# Patient Record
Sex: Male | Born: 1975 | Race: Black or African American | Hispanic: No | Marital: Married | Smoking: Never smoker
Health system: Southern US, Community
[De-identification: ages and names within clinical notes are randomized; demographics above are authoritative.]

## PROBLEM LIST (undated history)

## (undated) DIAGNOSIS — J45909 Unspecified asthma, uncomplicated: Secondary | ICD-10-CM

## (undated) DIAGNOSIS — I10 Essential (primary) hypertension: Secondary | ICD-10-CM

## (undated) DIAGNOSIS — E7800 Pure hypercholesterolemia, unspecified: Secondary | ICD-10-CM

## (undated) HISTORY — PX: HERNIA REPAIR: SHX51

## (undated) HISTORY — PX: OTHER SURGICAL HISTORY: SHX169

---

## 2018-02-26 ENCOUNTER — Other Ambulatory Visit: Payer: Self-pay

## 2018-02-26 ENCOUNTER — Encounter (HOSPITAL_COMMUNITY): Payer: Self-pay | Admitting: *Deleted

## 2018-02-26 ENCOUNTER — Emergency Department (HOSPITAL_COMMUNITY)
Admission: EM | Admit: 2018-02-26 | Discharge: 2018-02-26 | Disposition: A | Discharge status: Home / Self Care | Payer: 59 | Attending: Emergency Medicine | Admitting: Emergency Medicine

## 2018-02-26 DIAGNOSIS — I10 Essential (primary) hypertension: Secondary | ICD-10-CM | POA: Insufficient documentation

## 2018-02-26 DIAGNOSIS — J45909 Unspecified asthma, uncomplicated: Secondary | ICD-10-CM | POA: Insufficient documentation

## 2018-02-26 DIAGNOSIS — R51 Headache: Secondary | ICD-10-CM | POA: Insufficient documentation

## 2018-02-26 DIAGNOSIS — Z79899 Other long term (current) drug therapy: Secondary | ICD-10-CM | POA: Insufficient documentation

## 2018-02-26 HISTORY — DX: Unspecified asthma, uncomplicated: J45.909

## 2018-02-26 HISTORY — DX: Pure hypercholesterolemia, unspecified: E78.00

## 2018-02-26 HISTORY — DX: Essential (primary) hypertension: I10

## 2018-02-26 MED ORDER — LOSARTAN POTASSIUM 50 MG PO TABS: 50.0000 mg | ORAL_TABLET | Freq: Every day | ORAL | 0 refills | Status: AC

## 2018-02-26 NOTE — ED Notes (Signed)
Patient verbalizes understanding of discharge instructions. Opportunity for questioning and answers were provided. Armband removed by staff, pt discharged from ED.  

## 2018-02-26 NOTE — ED Provider Notes (Signed)
[facility] Provider Note   CSN: [id number] Arrival date & time: 02/26/18  1335     History   Chief Complaint Chief Complaint  Patient presents with  . Hypertension  . Headache    HPI Fred Washington is a 43 y.o. male.  Pt presents to the ED today with elevated BP.  The pt has a hx of HTN and does not have a pcp here in town.  [place] and [place] medical insurance.  He admits he should have gotten a doctor by now, but has not made his health a priority.  He has a stressful job and [employer] also.  Pt ran out of his BP meds yesterday and had a headache today.  He went to urgent care, but they would not refill his meds.  They told him to come here.  The pt denies any cp or sob.  Headache is mild and no neurologic sx.     Past Medical History:  Diagnosis Date  . Asthma   . Hypercholesteremia   . Hypertension     There are no active problems to display for this patient.   Past Surgical History:  Procedure Laterality Date  . HERNIA REPAIR     inguinal, L  . uretheral repair          Home Medications    Prior to Admission medications   Medication Sig Start Date End Date Taking? Authorizing Provider  losartan (COZAAR) 50 MG tablet Take 1 tablet (50 mg total) by mouth daily. 02/26/18   Jacalyn LefevreHaviland, Labradford Schnitker, MD    Family History No family history on file.  Social History Social History   Tobacco Use  . Smoking status: Never Smoker  . Smokeless tobacco: Never Used  Substance Use Topics  . Alcohol use: Yes  . Drug use: Never     Allergies   Patient has no known allergies.   Review of Systems Review of Systems  Neurological: Positive for headaches.  All other systems reviewed and are negative.    Physical Exam Updated Vital Signs BP (!) 155/109 (BP Location: Left Arm)   Pulse 60   Temp 98.4 F (36.9 C) (Oral)   Resp 18   Ht 6\' 5"  (1.956 m)   Wt 115.7 kg   SpO2 98%   BMI 30.24 kg/m   Physical  Exam Vitals signs and nursing note reviewed.  Constitutional:      Appearance: He is well-developed and normal weight.  HENT:     Head: Normocephalic and atraumatic.     Mouth/Throat:     Mouth: Mucous membranes are moist.     Pharynx: Oropharynx is clear.  Eyes:     Extraocular Movements: Extraocular movements intact.     Pupils: Pupils are equal, round, and reactive to light.  Neck:     Musculoskeletal: Normal range of motion and neck supple.  Cardiovascular:     Rate and Rhythm: Normal rate and regular rhythm.     Heart sounds: Normal heart sounds.  Pulmonary:     Effort: Pulmonary effort is normal.     Breath sounds: Normal breath sounds.  Abdominal:     General: Bowel sounds are normal.     Palpations: Abdomen is soft.  Musculoskeletal: Normal range of motion.  Skin:    General: Skin is warm and dry.     Capillary Refill: Capillary refill takes less than 2 seconds.  Neurological:     Mental  Status: He is alert and oriented to person, place, and time.  Psychiatric:        Mood and Affect: Mood normal.        Speech: Speech normal.        Behavior: Behavior normal.      ED Treatments / Results  Labs (all labs ordered are listed, but only abnormal results are displayed) Labs Reviewed - No data to display  EKG None  Radiology No results found.  Procedures Procedures (including critical care time)  Medications Ordered in ED Medications - No data to display   Initial Impression / Assessment and Plan / ED Course  I have reviewed the triage vital signs and the nursing notes.  Pertinent labs & imaging results that were available during my care of the patient were reviewed by me and considered in my medical decision making (see chart for details).    Pt's losartan will be refilled for a 1 month supply.  That gives him time to find a pcp and to establish care.  He knows to return at any time.  Final Clinical Impressions(s) / ED Diagnoses   Final diagnoses:   Essential hypertension    ED Discharge Orders         Ordered    losartan (COZAAR) 50 MG tablet  Daily     02/26/18 1402           Jacalyn Lefevre, MD 02/26/18 1407

## 2018-02-26 NOTE — ED Triage Notes (Signed)
Pt here from [facility] for bp meds.  Recently moved here from [place] an does not have PCP.  [facility] doc stated that since his diastolic was 100 he needed to come here.  States mild headache.  Denies blurred vision, dizziness, etc.

## 2018-03-10 DIAGNOSIS — I10 Essential (primary) hypertension: Secondary | ICD-10-CM | POA: Insufficient documentation

## 2021-04-23 ENCOUNTER — Ambulatory Visit: Payer: 59 | Admitting: Sports Medicine

## 2021-04-23 NOTE — Progress Notes (Signed)
? ? Fred Washington D.Judd Gaudier ?[facility] ?[address] [place] [address] ?Phone: [phone] ?  ?Assessment and Plan:   ?  ?1. Chronic bilateral low back pain without sciatica ?2. Somatic dysfunction of lumbar region ?3. Somatic dysfunction of pelvic region ?4. Somatic dysfunction of sacral region ?-Chronic with exacerbation, initial sports medicine visit ?- Chronic low back pain with pain starting 4 months ago, generally improving, and then flaring over the past week ?- Suspect strain of lower lumbar musculature based on HPI, physical exam, unremarkable x-rays ?- Start meloxicam 15 mg daily x2 weeks.  If still having pain after 2 weeks, complete 3rd-week of meloxicam. May use remaining meloxicam as needed once daily for pain control.  Do not to use additional NSAIDs while taking meloxicam.  May use Tylenol [phone] mg 2 to 3 times a day for breakthrough pain. ?- Start HEP for low back and posterior chain ?- X-ray obtained in clinic.  My interpretation: No acute fracture, no pars fracture, no vertebral collapse.  Unremarkable lumbar x-ray ?- Patient has received significant relief with OMT in the past.  Elects for repeat OMT today.  Tolerated well per note below. ?- Decision today to treat with OMT was based on Physical Exam ? ?After verbal consent patient was treated with HVLA (high velocity low amplitude), ME (muscle energy), FPR (flex positional release), ST (soft tissue), PC/PD (Pelvic Compression/ Pelvic Decompression) techniques in   thoracic, lumbar, and pelvic areas. Patient tolerated the procedure well with improvement in symptoms.  Patient educated on potential side effects of soreness and recommended to rest, hydrate, and use Tylenol as needed for pain control.  ?  ?Pertinent previous records reviewed include none ?  ?Follow Up: 3 weeks for reevaluation.  Could consider repeat TEE if patient found it beneficial today ?  ?Subjective:   ?I, Fred Washington, am serving as a  Neurosurgeon for Doctor [employer] ? ?Chief Complaint: low back pain  ? ?HPI:  ?04/24/2021 ?Patient is a 46 year old male complaining of low back pain. Patient states that about 4 months ago he felt something when he was lifting weights after about a month it went a way last Thursday he felt something similar he was sitting Monday for a meeting and that feeling came back . Feels like a spasm , the base of his spine and it makes it hard to stand up and walk , no numbness tingling, pain along the low back , has been taking advil and it eases the pain, started using ice yesterday hasn't been using heat other than the shower. To relieve the pain he likes to turn his body to feel a pop to give it some relief ? ?Relevant Historical Information: Hypertension, hyperlipidemia ? ?Additional pertinent review of systems negative. ? ? ?Current Outpatient Medications:  ?  losartan (COZAAR) 50 MG tablet, Take 1 tablet (50 mg total) by mouth daily., Disp: 30 tablet, Rfl: 0 ?  meloxicam (MOBIC) 15 MG tablet, Take 1 tablet (15 mg total) by mouth daily., Disp: 30 tablet, Rfl: 0  ? ?Objective:   ?  ?Vitals:  ? 04/24/21 0828  ?BP: 124/80  ?Pulse: 77  ?SpO2: 97%  ?Weight: 257 lb (116.6 kg)  ?Height: 6\' 5"  (1.956 m)  ?  ?  ?Body mass index is 30.48 kg/m?.  ?  ?Physical Exam:   ? ?Gen: Appears well, nad, nontoxic and pleasant ?Psych: Alert and oriented, appropriate mood and affect ?Neuro: sensation intact, strength is 5/5 in upper  and lower extremities, muscle tone wnl ?Skin: no susupicious lesions or rashes ? ?Back - Normal skin, Spine with normal alignment and no deformity.   ?No tenderness to vertebral process palpation.   ?Lumbar paraspinous muscles are mildly tender and without spasm ?Straight leg raise negative ?Trendelenberg negative ?  ? ?OMT Physical Exam: ? ?ASIS Compression Test: Positive Right ?Sacrum: Negative sphinx, NTTP sacral base bilaterally ?Lumbar: Mildly TTP paraspinal, L2 RRSR ?Pelvis: Right anterior innominate   ? ? ?Electronically signed by:  ?Fred Washington D.Judd Gaudier ?[facility] ?9:10 AM 04/24/21 ?

## 2021-04-24 ENCOUNTER — Ambulatory Visit: Payer: 59 | Admitting: Sports Medicine

## 2021-04-24 ENCOUNTER — Ambulatory Visit (INDEPENDENT_AMBULATORY_CARE_PROVIDER_SITE_OTHER): Payer: 59

## 2021-04-24 ENCOUNTER — Other Ambulatory Visit: Payer: Self-pay

## 2021-04-24 VITALS — BP 124/80 | HR 77 | Ht 77.0 in | Wt 257.0 lb

## 2021-04-24 DIAGNOSIS — G8929 Other chronic pain: Secondary | ICD-10-CM | POA: Diagnosis not present

## 2021-04-24 DIAGNOSIS — M9903 Segmental and somatic dysfunction of lumbar region: Secondary | ICD-10-CM | POA: Diagnosis not present

## 2021-04-24 DIAGNOSIS — M545 Low back pain, unspecified: Secondary | ICD-10-CM

## 2021-04-24 DIAGNOSIS — E782 Mixed hyperlipidemia: Secondary | ICD-10-CM | POA: Insufficient documentation

## 2021-04-24 DIAGNOSIS — M9904 Segmental and somatic dysfunction of sacral region: Secondary | ICD-10-CM

## 2021-04-24 DIAGNOSIS — M9905 Segmental and somatic dysfunction of pelvic region: Secondary | ICD-10-CM | POA: Diagnosis not present

## 2021-04-24 MED ORDER — MELOXICAM 15 MG PO TABS: 15.0000 mg | ORAL_TABLET | Freq: Every day | ORAL | 0 refills | Status: AC

## 2021-04-24 NOTE — Patient Instructions (Addendum)
Good to see you  - Start meloxicam 15 mg daily x2 weeks.  If still having pain after 2 weeks, complete 3rd-week of meloxicam. May use remaining meloxicam as needed once daily for pain control.  Do not to use additional NSAIDs while taking meloxicam.  May use Tylenol 500-1000 mg 2 to 3 times a day for breakthrough pain. Low back HEP 3 week follow up  

## 2021-05-21 NOTE — Progress Notes (Deleted)
    Benito Mccreedy D.Cowden Harper Phone: [phone]   Assessment and Plan:     There are no diagnoses linked to this encounter.  ***   Pertinent previous records reviewed include ***   Follow Up: ***     Subjective:   I, Sage Hammill, am serving as a Education administrator for Doctor Glennon Mac   Chief Complaint: low back pain    HPI:  04/24/2021 Patient is a 46 year old male complaining of low back pain. Patient states that about 4 months ago he felt something when he was lifting weights after about a month it went a way last Thursday he felt something similar he was sitting Monday for a meeting and that feeling came back . Feels like a spasm , the base of his spine and it makes it hard to stand up and walk , no numbness tingling, pain along the low back , has been taking advil and it eases the pain, started using ice yesterday hasn't been using heat other than the shower. To relieve the pain he likes to turn his body to feel a pop to give it some relief  05/22/2021 Patient states     Relevant Historical Information: Hypertension, hyperlipidemia  Additional pertinent review of systems negative.   Current Outpatient Medications:    losartan (COZAAR) 50 MG tablet, Take 1 tablet (50 mg total) by mouth daily., Disp: 30 tablet, Rfl: 0   meloxicam (MOBIC) 15 MG tablet, Take 1 tablet (15 mg total) by mouth daily., Disp: 30 tablet, Rfl: 0   Objective:     There were no vitals filed for this visit.    There is no height or weight on file to calculate BMI.    Physical Exam:    ***   Electronically signed by:  Benito Mccreedy D.[facility] 2:58 PM 05/21/21

## 2021-05-22 ENCOUNTER — Ambulatory Visit: Payer: 59 | Admitting: Sports Medicine

## 2021-06-07 ENCOUNTER — Emergency Department (HOSPITAL_BASED_OUTPATIENT_CLINIC_OR_DEPARTMENT_OTHER)
Admission: EM | Admit: 2021-06-07 | Discharge: 2021-06-07 | Disposition: A | Discharge status: Home / Self Care | Payer: 59 | Attending: Emergency Medicine | Admitting: Emergency Medicine

## 2021-06-07 ENCOUNTER — Encounter (HOSPITAL_BASED_OUTPATIENT_CLINIC_OR_DEPARTMENT_OTHER): Payer: Self-pay | Admitting: Emergency Medicine

## 2021-06-07 ENCOUNTER — Other Ambulatory Visit: Payer: Self-pay

## 2021-06-07 ENCOUNTER — Emergency Department (HOSPITAL_BASED_OUTPATIENT_CLINIC_OR_DEPARTMENT_OTHER): Payer: 59

## 2021-06-07 DIAGNOSIS — W01198A Fall on same level from slipping, tripping and stumbling with subsequent striking against other object, initial encounter: Secondary | ICD-10-CM | POA: Diagnosis not present

## 2021-06-07 DIAGNOSIS — S0181XA Laceration without foreign body of other part of head, initial encounter: Secondary | ICD-10-CM | POA: Diagnosis not present

## 2021-06-07 DIAGNOSIS — S0993XA Unspecified injury of face, initial encounter: Secondary | ICD-10-CM | POA: Diagnosis present

## 2021-06-07 DIAGNOSIS — R7989 Other specified abnormal findings of blood chemistry: Secondary | ICD-10-CM | POA: Insufficient documentation

## 2021-06-07 DIAGNOSIS — J45909 Unspecified asthma, uncomplicated: Secondary | ICD-10-CM | POA: Diagnosis not present

## 2021-06-07 DIAGNOSIS — F10120 Alcohol abuse with intoxication, uncomplicated: Secondary | ICD-10-CM | POA: Insufficient documentation

## 2021-06-07 DIAGNOSIS — E860 Dehydration: Secondary | ICD-10-CM | POA: Diagnosis not present

## 2021-06-07 DIAGNOSIS — R55 Syncope and collapse: Secondary | ICD-10-CM | POA: Insufficient documentation

## 2021-06-07 DIAGNOSIS — Z23 Encounter for immunization: Secondary | ICD-10-CM | POA: Insufficient documentation

## 2021-06-07 DIAGNOSIS — F10920 Alcohol use, unspecified with intoxication, uncomplicated: Secondary | ICD-10-CM

## 2021-06-07 DIAGNOSIS — Y906 Blood alcohol level of 120-199 mg/100 ml: Secondary | ICD-10-CM | POA: Insufficient documentation

## 2021-06-07 LAB — COMPREHENSIVE METABOLIC PANEL
ALT: 38 U/L (ref 0–44)
AST: 30 U/L (ref 15–41)
Albumin: 4.4 g/dL (ref 3.5–5.0)
Alkaline Phosphatase: 25 U/L — ABNORMAL LOW (ref 38–126)
Anion gap: 16 — ABNORMAL HIGH (ref 5–15)
BUN: 12 mg/dL (ref 6–20)
CO2: 20 mmol/L — ABNORMAL LOW (ref 22–32)
Calcium: 9.2 mg/dL (ref 8.9–10.3)
Chloride: 103 mmol/L (ref 98–111)
Creatinine, Ser: 1.5 mg/dL — ABNORMAL HIGH (ref 0.61–1.24)
GFR, Estimated: 59 mL/min — ABNORMAL LOW (ref >60)
Glucose, Bld: 175 mg/dL — ABNORMAL HIGH (ref 70–99)
Potassium: 4.2 mmol/L (ref 3.5–5.1)
Sodium: 139 mmol/L (ref 135–145)
Total Bilirubin: 0.8 mg/dL (ref 0.3–1.2)
Total Protein: 7.2 g/dL (ref 6.5–8.1)

## 2021-06-07 LAB — CBC
HCT: 44 % (ref 39.0–52.0)
Hemoglobin: 13.8 g/dL (ref 13.0–17.0)
MCH: 25.5 pg — ABNORMAL LOW (ref 26.0–34.0)
MCHC: 31.4 g/dL (ref 30.0–36.0)
MCV: 81.2 fL (ref 80.0–100.0)
Platelets: 246 10*3/uL (ref 150–400)
RBC: 5.42 MIL/uL (ref 4.22–5.81)
RDW: 14.8 % (ref 11.5–15.5)
WBC: 5.3 10*3/uL (ref 4.0–10.5)
nRBC: 0 % (ref 0.0–0.2)

## 2021-06-07 LAB — ETHANOL: Alcohol, Ethyl (B): 190 mg/dL — ABNORMAL HIGH (ref <10)

## 2021-06-07 MED ORDER — LIDOCAINE HCL (PF) 1 % IJ SOLN
10.0000 mL | Freq: Once | INTRAMUSCULAR | Status: AC
  Administered 2021-06-07: 10 mL
  Filled 2021-06-07: qty 10

## 2021-06-07 MED ORDER — TETANUS-DIPHTH-ACELL PERTUSSIS 5-2.5-18.5 LF-MCG/0.5 IM SUSY
0.5000 mL | PREFILLED_SYRINGE | Freq: Once | INTRAMUSCULAR | Status: AC
  Administered 2021-06-07: 0.5 mL via INTRAMUSCULAR
  Filled 2021-06-07: qty 0.5

## 2021-06-07 MED ORDER — SODIUM CHLORIDE 0.9 % IV BOLUS (SEPSIS)
1000.0000 mL | Freq: Once | INTRAVENOUS | Status: AC
  Administered 2021-06-07: 1000 mL via INTRAVENOUS

## 2021-06-07 NOTE — ED Provider Notes (Signed)
..  Laceration Repair ? ?Date/Time: 06/07/2021 6:44 PM ?Performed by: Bud Face, PA-C ?Authorized by: Bud Face, PA-C  ? ?Consent:  ?  Consent obtained:  Verbal ?  Consent given by:  Patient ?  Risks, benefits, and alternatives were discussed: yes   ?  Risks discussed:  Infection, pain, retained foreign body, need for additional repair, poor cosmetic result, tendon damage, nerve damage, poor wound healing and vascular damage ?  Alternatives discussed:  No treatment and delayed treatment ?Universal protocol:  ?  Procedure explained and questions answered to patient or proxy's satisfaction: yes   ?  Test results available: yes   ?  Imaging studies available: yes   ?  Patient identity confirmed:  Verbally with patient ?Anesthesia:  ?  Anesthesia method:  Local infiltration ?  Local anesthetic:  Lidocaine 1% w/o epi ?Laceration details:  ?  Location:  Face ?  Face location:  L eyebrow ?  Length (cm):  5 ?  Depth (mm):  3 ?Treatment:  ?  Area cleansed with:  Saline ?  Amount of cleaning:  Standard ?  Irrigation solution:  Sterile saline ?  Irrigation volume:  500 ml ?  Irrigation method:  Pressure wash ?Skin repair:  ?  Repair method:  Sutures ?  Suture size:  5-0 ?  Suture material:  Fast-absorbing gut ?  Suture technique:  Simple interrupted ?  Number of sutures:  8 ?Approximation:  ?  Approximation:  Close ?Repair type:  ?  Repair type:  Simple ?Post-procedure details:  ?  Dressing:  Antibiotic ointment and non-adherent dressing ?  Procedure completion:  Tolerated well, no immediate complications ? ?  ?Bud Face, PA-C ?06/07/21 1845 ? ?  ?Dorie Rank, MD ?06/08/21 2121 ? ?

## 2021-06-07 NOTE — Discharge Instructions (Addendum)
Make sure to rest and drink plenty of fluids.  Your alcohol level was significantly elevated today.  This should safely metabolize overnight.  The sutures placed today will need to be removed in approximately 5 days.  This can be performed at your doctor's office and urgent care or you could return to the emergency department. ?

## 2021-06-07 NOTE — ED Provider Notes (Signed)
?[facility] EMERGENCY DEPT ?Provider Note ? ? ?CSN: [id number] ?Arrival date & time: 06/07/21  1548 ? ?  ? ?History ? ?Chief Complaint  ?Patient presents with  ? Loss of Consciousness  ? ? ?Fred Washington is a 46 y.o. male. ? ? ?Loss of Consciousness ?Associated symptoms: no fever   ? ?  ?Patient has history of pretension, asthma, hypercholesterolemia.  He was at [employer] and when he went to stand up he became lightheaded and fell forward striking his face and losing consciousness.  Patient sustained a laceration above his left eye.  When EMS arrived they noted that his blood pressure was low.  Patient apparently had a loss of consciousness for about a minute.  Patient reportedly was drinking alcohol today.  Patient denies any chest pain or abdominal pain.  No extremity pain.  He denies any neck pain or facial pain.  He does not remember the whole event. ?Home Medications ?Prior to Admission medications   ?Medication Sig Start Date End Date Taking? Authorizing Provider  ?losartan (COZAAR) 50 MG tablet Take 1 tablet (50 mg total) by mouth daily. 02/26/18   Jacalyn Lefevre, MD  ?meloxicam (MOBIC) 15 MG tablet Take 1 tablet (15 mg total) by mouth daily. 04/24/21   Richardean Sale, DO  ?   ? ?Allergies    ?Gramineae pollens   ? ?Review of Systems   ?Review of Systems  ?Constitutional:  Negative for fever.  ?Cardiovascular:  Positive for syncope.  ? ?Physical Exam ?Updated Vital Signs ?BP 112/65   Pulse 81   Temp 98.1 ?F (36.7 ?C) (Oral)   Resp 12   Ht 1.956 m (6\' 5" )   Wt 113.4 kg   SpO2 99%   BMI 29.65 kg/m?  ?Physical Exam ?Vitals and nursing note reviewed.  ?Constitutional:   ?   General: He is not in acute distress. ?   Appearance: He is well-developed.  ?HENT:  ?   Head: Normocephalic.  ?   Comments: Irregular laceration above the left eyebrow ?   Right Ear: External ear normal.  ?   Left Ear: External ear normal.  ?Eyes:  ?   General: No scleral icterus.    ?   Right eye: No discharge.     ?    Left eye: No discharge.  ?   Conjunctiva/sclera: Conjunctivae normal.  ?Neck:  ?   Trachea: No tracheal deviation.  ?Cardiovascular:  ?   Rate and Rhythm: Normal rate and regular rhythm.  ?Pulmonary:  ?   Effort: Pulmonary effort is normal. No respiratory distress.  ?   Breath sounds: Normal breath sounds. No stridor. No wheezing or rales.  ?Abdominal:  ?   General: Bowel sounds are normal. There is no distension.  ?   Palpations: Abdomen is soft.  ?   Tenderness: There is no abdominal tenderness. There is no guarding or rebound.  ?Musculoskeletal:     ?   General: No tenderness or deformity.  ?   Cervical back: Neck supple.  ?Skin: ?   General: Skin is warm and dry.  ?   Findings: No rash.  ?Neurological:  ?   General: No focal deficit present.  ?   Mental Status: He is alert and oriented to person, place, and time.  ?   Cranial Nerves: No cranial nerve deficit (no facial droop, extraocular movements intact, no slurred speech).  ?   Sensory: No sensory deficit.  ?   Motor: No abnormal muscle tone or seizure  activity.  ?   Coordination: Coordination normal.  ?   Comments: Listless  ?Psychiatric:     ?   Mood and Affect: Mood normal.  ? ? ?ED Results / Procedures / Treatments   ?Labs ?(all labs ordered are listed, but only abnormal results are displayed) ?Labs Reviewed  ?CBC - Abnormal; Notable for the following components:  ?    Result Value  ? MCH 25.5 (*)   ? All other components within normal limits  ?ETHANOL - Abnormal; Notable for the following components:  ? Alcohol, Ethyl (B) 190 (*)   ? All other components within normal limits  ?COMPREHENSIVE METABOLIC PANEL - Abnormal; Notable for the following components:  ? CO2 20 (*)   ? Glucose, Bld 175 (*)   ? Creatinine, Ser 1.50 (*)   ? Alkaline Phosphatase 25 (*)   ? GFR, Estimated 59 (*)   ? Anion gap 16 (*)   ? All other components within normal limits  ? ? ?EKG ?None ? ?Radiology ?CT Head Wo Contrast ? ?Result Date: 06/07/2021 ?CLINICAL DATA:  Trauma fall EXAM: CT  HEAD WITHOUT CONTRAST CT CERVICAL SPINE WITHOUT CONTRAST TECHNIQUE: Multidetector CT imaging of the head and cervical spine was performed following the standard protocol without intravenous contrast. Multiplanar CT image reconstructions of the cervical spine were also generated. RADIATION DOSE REDUCTION: This exam was performed according to the departmental dose-optimization program which includes automated exposure control, adjustment of the mA and/or kV according to patient size and/or use of iterative reconstruction technique. COMPARISON:  None Available. FINDINGS: CT HEAD FINDINGS Brain: No acute territorial infarction, hemorrhage or intracranial mass. The ventricles are nonenlarged. Vascular: No hyperdense vessels.  No unexpected calcification Skull: Normal. Negative for fracture or focal lesion. Sinuses/Orbits: Patchy mucosal thickening in the sinuses Other: None CT CERVICAL SPINE FINDINGS Alignment: Mild reversal of cervical lordosis. No subluxation. Facet alignment within normal limits Skull base and vertebrae: No acute fracture. No primary bone lesion or focal pathologic process. Soft tissues and spinal canal: No prevertebral fluid or swelling. No visible canal hematoma. Disc levels:  Within normal limits Upper chest: Negative. Other: None IMPRESSION: 1. Negative non contrasted CT appearance of the brain. 2. Mild reversal of cervical lordosis. No acute osseous abnormality. Electronically Signed   By: Jasmine Pang M.D.   On: 06/07/2021 16:50  ? ?CT Cervical Spine Wo Contrast ? ?Result Date: 06/07/2021 ?CLINICAL DATA:  Trauma fall EXAM: CT HEAD WITHOUT CONTRAST CT CERVICAL SPINE WITHOUT CONTRAST TECHNIQUE: Multidetector CT imaging of the head and cervical spine was performed following the standard protocol without intravenous contrast. Multiplanar CT image reconstructions of the cervical spine were also generated. RADIATION DOSE REDUCTION: This exam was performed according to the departmental dose-optimization  program which includes automated exposure control, adjustment of the mA and/or kV according to patient size and/or use of iterative reconstruction technique. COMPARISON:  None Available. FINDINGS: CT HEAD FINDINGS Brain: No acute territorial infarction, hemorrhage or intracranial mass. The ventricles are nonenlarged. Vascular: No hyperdense vessels.  No unexpected calcification Skull: Normal. Negative for fracture or focal lesion. Sinuses/Orbits: Patchy mucosal thickening in the sinuses Other: None CT CERVICAL SPINE FINDINGS Alignment: Mild reversal of cervical lordosis. No subluxation. Facet alignment within normal limits Skull base and vertebrae: No acute fracture. No primary bone lesion or focal pathologic process. Soft tissues and spinal canal: No prevertebral fluid or swelling. No visible canal hematoma. Disc levels:  Within normal limits Upper chest: Negative. Other: None IMPRESSION: 1. Negative non contrasted CT  appearance of the brain. 2. Mild reversal of cervical lordosis. No acute osseous abnormality. Electronically Signed   By: Jasmine PangKim  Fujinaga M.D.   On: 06/07/2021 16:50   ? ?Procedures ?Procedures  ? ? ?Medications Ordered in ED ?Medications  ?sodium chloride 0.9 % bolus 1,000 mL (0 mLs Intravenous Stopped 06/07/21 1733)  ?lidocaine (PF) (XYLOCAINE) 1 % injection 10 mL (10 mLs Infiltration Given 06/07/21 1733)  ?Tdap (BOOSTRIX) injection 0.5 mL (0.5 mLs Intramuscular Given 06/07/21 1734)  ? ? ?ED Course/ Medical Decision Making/ A&P ?Clinical Course as of 06/07/21 1844  ?Fri Jun 07, 2021  ?1703 Ethanol(!) ?Elevated at 190 [JK]  ?1704 Comprehensive metabolic panel(!) ?Creatinine elevated [JK]  ?1704 CT Cervical Spine Wo Contrast ?Head CT and C-spine CT without acute fractures [JK]  ?  ?Clinical Course User Index ?[JK] Linwood DibblesKnapp, Paden Kuras, MD  ? ?                        ?Medical Decision Making ?Problems Addressed: ?Alcoholic intoxication without complication (HCC): acute illness or injury ?   Details: No signs of DTs or  encephalopathy.  His alcohol intoxication likely contributed to his fall today. ?Dehydration: acute illness or injury ?   Details: She does have a slightly elevated creatinine.  Suspect dehydration contrib

## 2021-06-07 NOTE — ED Triage Notes (Addendum)
Pt arrives to ED via [facility] with c/o loss of consciousness and fall. Pt reports he was at a restaurant when he stood up, he became lightheaded then fell, and lost consciousness. Bystanders report that he hit his head on concrete. Per EMS pt initial BP was 80's palpated. Wife reports pt was unconscious for 1 minute. He reports ETOH for most of the day today.  ?

## 2023-04-15 IMAGING — CT CT CERVICAL SPINE W/O CM
3 of 4 series · 12 of 33 positions shown, 14 images · non-contrast
Comparison: None Available.

CLINICAL DATA: Trauma fall



[Series 6: cor bone · coronal · 0.27mm/px · 3 of 65 slices shown]
[im 13/65  bone]
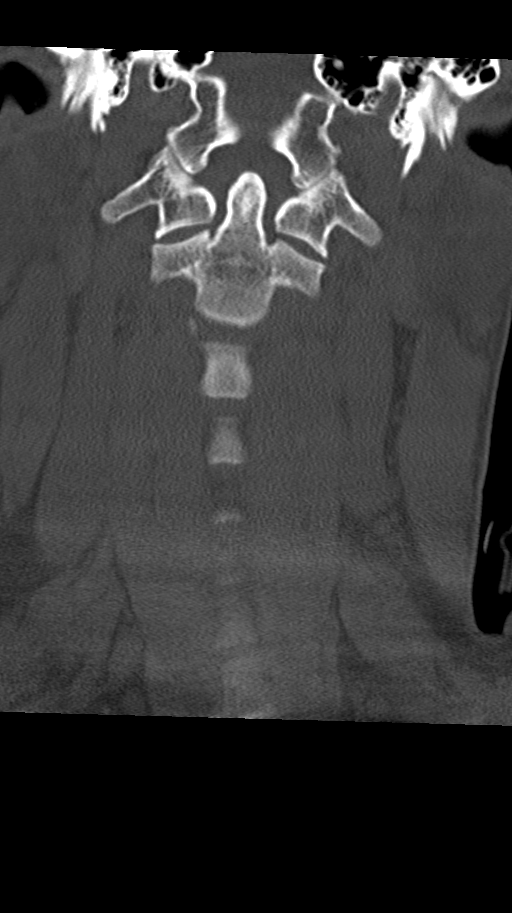
[im 26/65  bone]
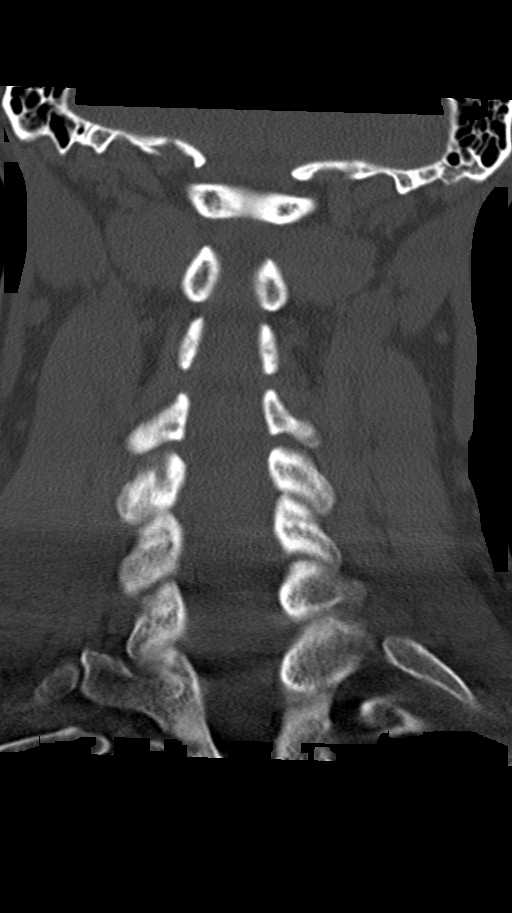
[im 39/65  bone]
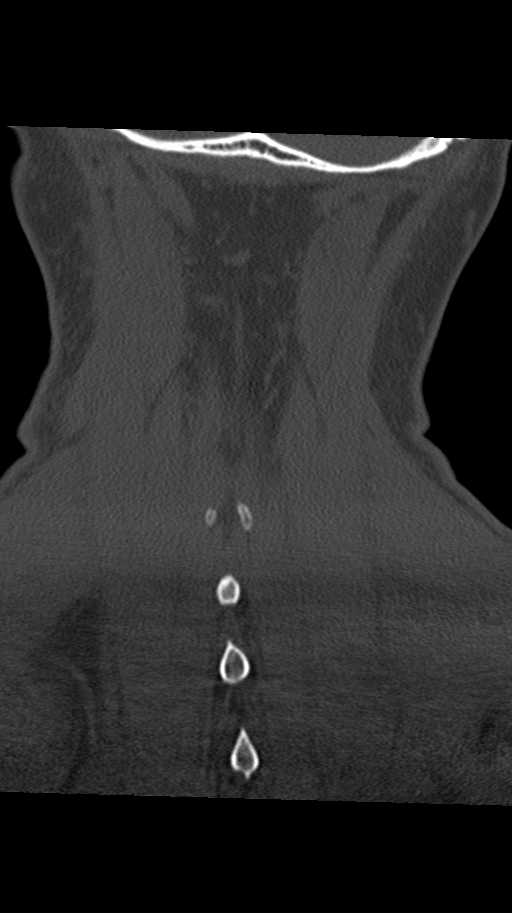

[Series 7: sag bone · sagittal · 0.30mm/px · 5 of 58 slices shown, 6 images]
[im 20/58  bone]
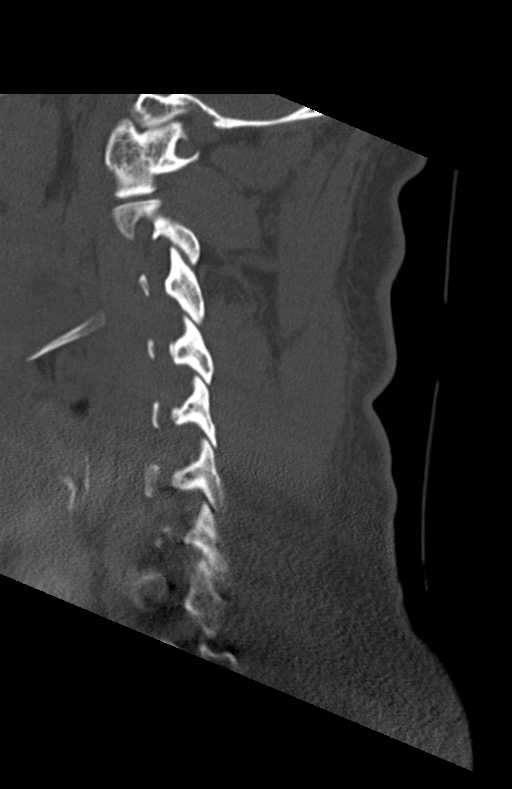
[im 24/58  bone]
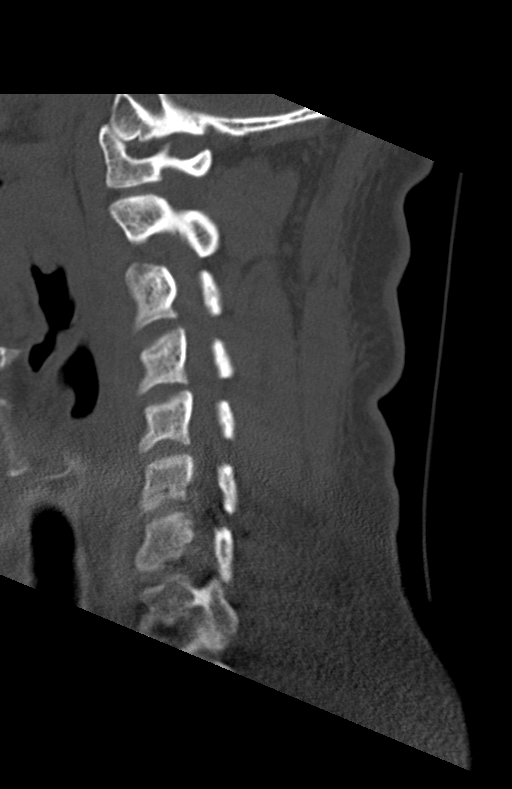
[im 29/58  soft-tissue]
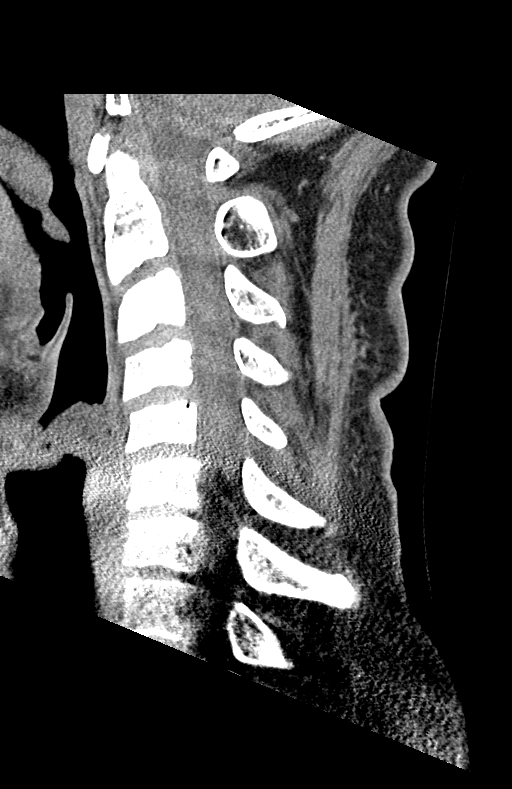
[im 29/58  bone]
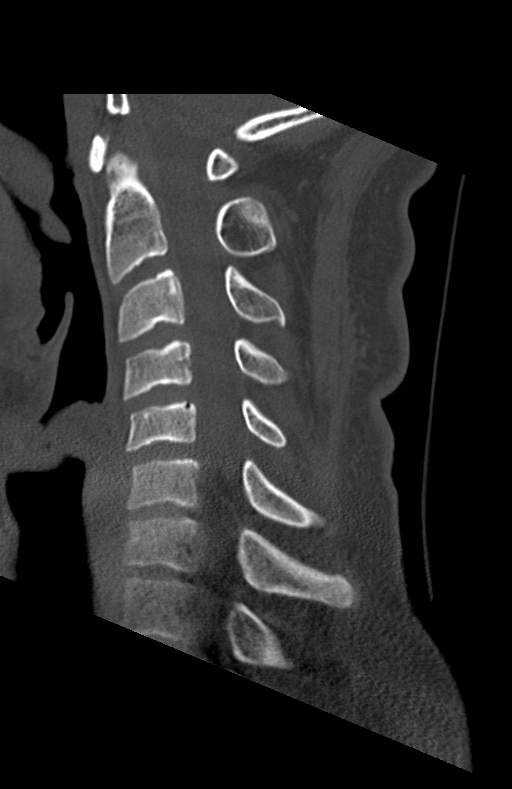
[im 34/58  bone]
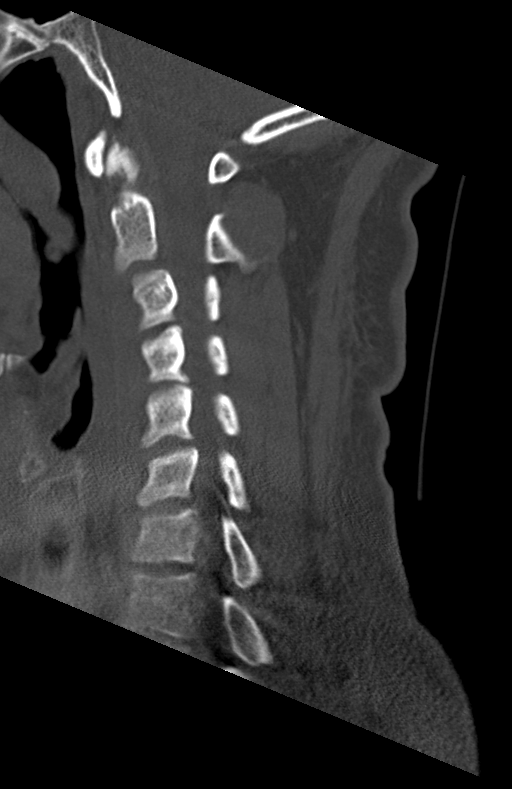
[im 39/58  bone]
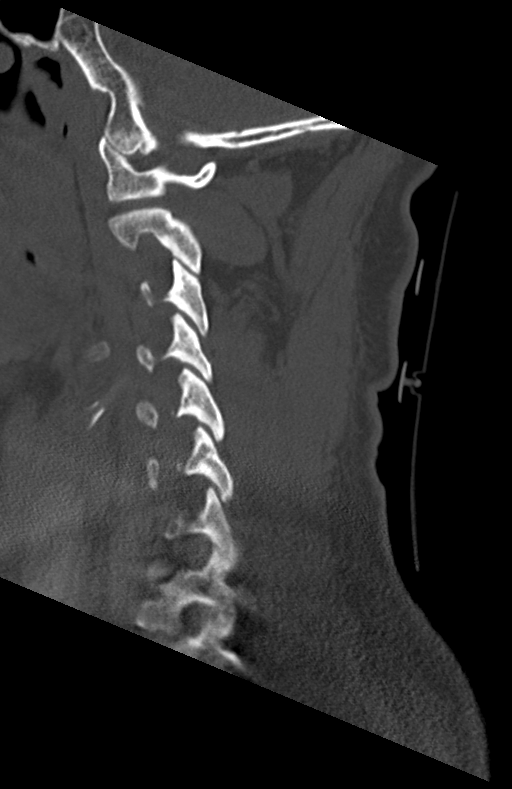

[Series 9: orthogonal axials · axial · 0.21mm/px · z∈[-321,-229]mm · 4 of 86 slices shown, 5 images]
[im 15/86  soft-tissue]
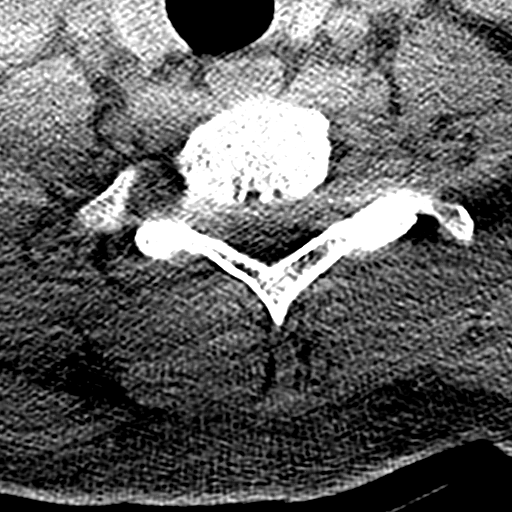
[im 15/86  bone]
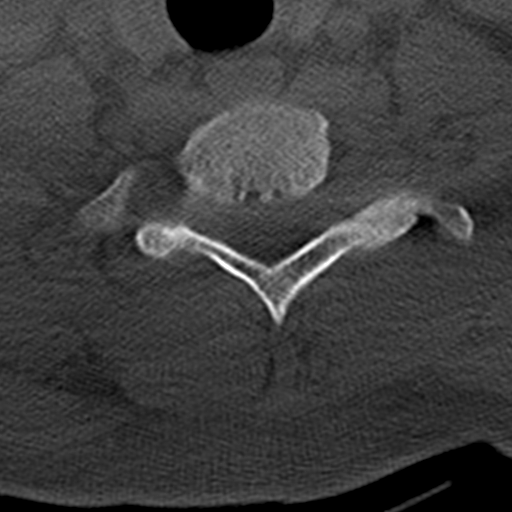
[im 29/86  bone]
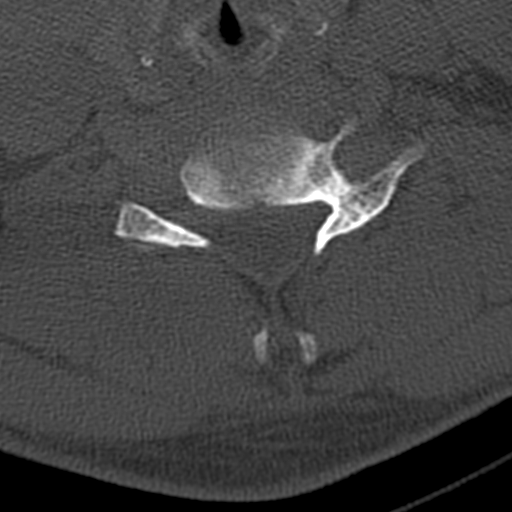
[im 57/86  bone]
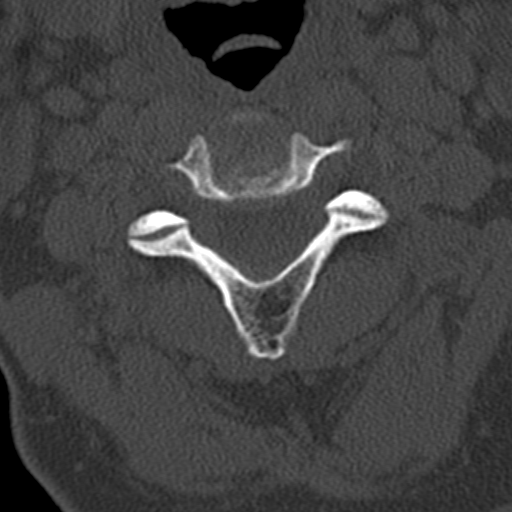
[im 71/86  bone]
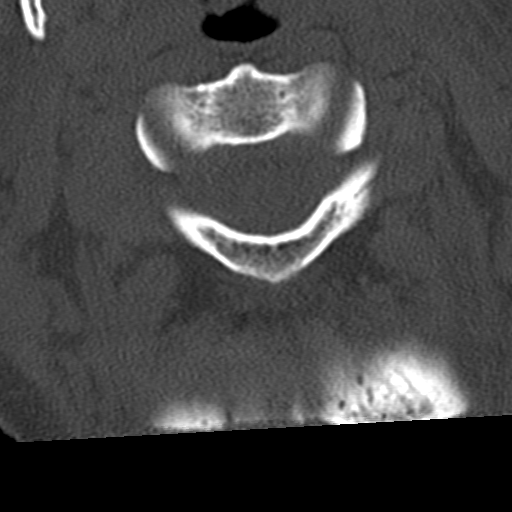

[12 of 33 positions shown; findings below may reference images not displayed]

FINDINGS: CT HEAD FINDINGS

Brain: No acute territorial infarction, hemorrhage or intracranial
mass. The ventricles are nonenlarged.

Vascular: No hyperdense vessels.  No unexpected calcification

Skull: Normal. Negative for fracture or focal lesion.

Sinuses/Orbits: Patchy mucosal thickening in the sinuses

Other: None

CT CERVICAL SPINE FINDINGS

Alignment: Mild reversal of cervical lordosis. No subluxation. Facet
alignment within normal limits

Skull base and vertebrae: No acute fracture. No primary bone lesion
or focal pathologic process.

Soft tissues and spinal canal: No prevertebral fluid or swelling. No
visible canal hematoma.

Disc levels:  Within normal limits

Upper chest: Negative.

Other: None
IMPRESSION: 1. Negative non contrasted CT appearance of the brain.
2. Mild reversal of cervical lordosis. No acute osseous abnormality.
